# Patient Record
Sex: Male | Born: 1975 | Race: White | Hispanic: No | Marital: Single | Smoking: Current every day smoker
Health system: Southern US, Community
[De-identification: ages and names within clinical notes are randomized; demographics above are authoritative.]

## PROBLEM LIST (undated history)

## (undated) DIAGNOSIS — M25371 Other instability, right ankle: Secondary | ICD-10-CM

## (undated) DIAGNOSIS — D62 Acute posthemorrhagic anemia: Secondary | ICD-10-CM

## (undated) DIAGNOSIS — S82141A Displaced bicondylar fracture of right tibia, initial encounter for closed fracture: Secondary | ICD-10-CM

## (undated) DIAGNOSIS — F17200 Nicotine dependence, unspecified, uncomplicated: Secondary | ICD-10-CM

## (undated) DIAGNOSIS — Z8489 Family history of other specified conditions: Secondary | ICD-10-CM

## (undated) DIAGNOSIS — R112 Nausea with vomiting, unspecified: Secondary | ICD-10-CM

## (undated) DIAGNOSIS — Z9889 Other specified postprocedural states: Secondary | ICD-10-CM

## (undated) DIAGNOSIS — S82401A Unspecified fracture of shaft of right fibula, initial encounter for closed fracture: Secondary | ICD-10-CM

## (undated) HISTORY — PX: FRACTURE SURGERY: SHX138

---

## 1998-07-17 ENCOUNTER — Emergency Department (HOSPITAL_COMMUNITY): Admission: EM | Admit: 1998-07-17 | Discharge: 1998-07-17 | Payer: Self-pay | Admitting: Emergency Medicine

## 2001-08-26 ENCOUNTER — Encounter: Payer: Self-pay | Admitting: Physical Medicine and Rehabilitation

## 2001-08-26 ENCOUNTER — Ambulatory Visit (HOSPITAL_COMMUNITY)
Admission: RE | Admit: 2001-08-26 | Discharge: 2001-08-26 | Payer: Self-pay | Admitting: Physical Medicine and Rehabilitation

## 2001-09-08 ENCOUNTER — Encounter: Payer: Self-pay | Admitting: Orthopedic Surgery

## 2001-09-09 ENCOUNTER — Encounter: Payer: Self-pay | Admitting: Orthopedic Surgery

## 2001-09-09 ENCOUNTER — Observation Stay (HOSPITAL_COMMUNITY): Admission: AD | Admit: 2001-09-09 | Discharge: 2001-09-09 | Payer: Self-pay | Admitting: Orthopedic Surgery

## 2002-04-09 ENCOUNTER — Encounter: Admission: RE | Admit: 2002-04-09 | Discharge: 2002-04-09 | Payer: Self-pay | Admitting: Neurology

## 2002-04-09 ENCOUNTER — Encounter: Payer: Self-pay | Admitting: Neurology

## 2004-06-05 ENCOUNTER — Ambulatory Visit: Payer: Self-pay | Admitting: Internal Medicine

## 2006-03-25 ENCOUNTER — Emergency Department (HOSPITAL_COMMUNITY): Admission: EM | Admit: 2006-03-25 | Discharge: 2006-03-25 | Payer: Self-pay | Admitting: Emergency Medicine

## 2006-04-05 ENCOUNTER — Ambulatory Visit (HOSPITAL_BASED_OUTPATIENT_CLINIC_OR_DEPARTMENT_OTHER): Admission: RE | Admit: 2006-04-05 | Discharge: 2006-04-05 | Payer: Self-pay | Admitting: Orthopedic Surgery

## 2008-12-31 ENCOUNTER — Ambulatory Visit (HOSPITAL_BASED_OUTPATIENT_CLINIC_OR_DEPARTMENT_OTHER): Admission: RE | Admit: 2008-12-31 | Discharge: 2008-12-31 | Payer: Self-pay | Admitting: Orthopedic Surgery

## 2010-11-14 LAB — POCT HEMOGLOBIN-HEMACUE: Hemoglobin: 15.6 g/dL (ref 13.0–17.0)

## 2010-12-19 NOTE — Op Note (Signed)
NAME:  Troy Rios, Troy Rios NO.:  [id number]   MEDICAL RECORD NO.:  [id number]          PATIENT TYPE:  AMB   LOCATION:  DSC                          FACILITY:  [facility]   PHYSICIAN:  Harvie Junior, M.D.   DATE OF BIRTH:  November 26, 1975   DATE OF PROCEDURE:  12/31/2008  DATE OF DISCHARGE:                               OPERATIVE REPORT   PREOPERATIVE DIAGNOSIS:  Painful hardware, right forearm.   POSTOPERATIVE DIAGNOSIS:  Painful hardware, right forearm.   PROCEDURE:  Removal of painful hardware, right forearm.   SURGEON:  Harvie Junior, M.D.   ASSISTANT:  None.   ANESTHESIA:  General.   BRIEF HISTORY:  [name] is a 35 year old male with long history  of having had fracture in the right ulnar.  He began having pain over  the area of the hardware and after failure of conservative care, he was  also taken to the operating room for removal of painful hardware.   PROCEDURE IN DETAIL:  The patient was taken to the operating room.  After adequate anesthesia obtained with general anesthetic, the patient  was placed supine on the operating room table.  The right arm was  prepped and draped in usual sterile fashion.  Following this, the lower  arm was exsanguinated with blood pressure.  Tourniquet was inflated to  250 mmHg.  Following this, incision was made over his old incision in  subcutaneous tissue  down to the level of the hardware.  Hardware was  identified and all screws were removed as well as plates.  The wound was  curetted around the bone.  Once that was completed, the wound was  copiously and thoroughly irrigated, suctioned dry.  The wound was then  closed with a combination of 2-0 Vicryl and 3-0 Monocryl subcuticular.  Benzoin and Steri-Strips were applied.  A 10 mL of 0.25% Marcaine was  instilled into wound for postoperative anesthesia.  A sterile  compressive dressing was applied.  At this point, the patient was taken  to the recovery and was noted to  be in a satisfactory condition.  Estimated blood loss for this procedure was none.      Harvie Junior, M.D.  Electronically Signed     JLG/MEDQ  D:  12/31/2008  T:  12/31/2008  Job:  [id number]
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
## 2016-10-18 ENCOUNTER — Emergency Department (HOSPITAL_COMMUNITY): Payer: 59

## 2016-10-18 ENCOUNTER — Inpatient Hospital Stay (HOSPITAL_COMMUNITY)
Admission: EM | Admit: 2016-10-18 | Discharge: 2016-10-23 | DRG: 481 | Disposition: A | Discharge status: Home Health | Payer: 59 | Attending: Orthopedic Surgery | Admitting: Orthopedic Surgery

## 2016-10-18 ENCOUNTER — Encounter (HOSPITAL_COMMUNITY): Payer: Self-pay

## 2016-10-18 DIAGNOSIS — S8264XA Nondisplaced fracture of lateral malleolus of right fibula, initial encounter for closed fracture: Secondary | ICD-10-CM | POA: Diagnosis present

## 2016-10-18 DIAGNOSIS — Z881 Allergy status to other antibiotic agents status: Secondary | ICD-10-CM | POA: Diagnosis not present

## 2016-10-18 DIAGNOSIS — M25371 Other instability, right ankle: Secondary | ICD-10-CM | POA: Diagnosis present

## 2016-10-18 DIAGNOSIS — S42001A Fracture of unspecified part of right clavicle, initial encounter for closed fracture: Secondary | ICD-10-CM

## 2016-10-18 DIAGNOSIS — S82141A Displaced bicondylar fracture of right tibia, initial encounter for closed fracture: Secondary | ICD-10-CM | POA: Diagnosis present

## 2016-10-18 DIAGNOSIS — D62 Acute posthemorrhagic anemia: Secondary | ICD-10-CM | POA: Diagnosis not present

## 2016-10-18 DIAGNOSIS — S9304XA Dislocation of right ankle joint, initial encounter: Secondary | ICD-10-CM | POA: Diagnosis present

## 2016-10-18 DIAGNOSIS — M7042 Prepatellar bursitis, left knee: Secondary | ICD-10-CM | POA: Diagnosis present

## 2016-10-18 DIAGNOSIS — S82401A Unspecified fracture of shaft of right fibula, initial encounter for closed fracture: Secondary | ICD-10-CM | POA: Diagnosis present

## 2016-10-18 DIAGNOSIS — S7290XA Unspecified fracture of unspecified femur, initial encounter for closed fracture: Secondary | ICD-10-CM | POA: Diagnosis present

## 2016-10-18 DIAGNOSIS — Q899 Congenital malformation, unspecified: Secondary | ICD-10-CM

## 2016-10-18 DIAGNOSIS — M79604 Pain in right leg: Secondary | ICD-10-CM | POA: Diagnosis present

## 2016-10-18 DIAGNOSIS — S72402A Unspecified fracture of lower end of left femur, initial encounter for closed fracture: Secondary | ICD-10-CM

## 2016-10-18 DIAGNOSIS — S72351A Displaced comminuted fracture of shaft of right femur, initial encounter for closed fracture: Secondary | ICD-10-CM | POA: Diagnosis present

## 2016-10-18 DIAGNOSIS — Z419 Encounter for procedure for purposes other than remedying health state, unspecified: Secondary | ICD-10-CM

## 2016-10-18 DIAGNOSIS — Z880 Allergy status to penicillin: Secondary | ICD-10-CM

## 2016-10-18 DIAGNOSIS — F17200 Nicotine dependence, unspecified, uncomplicated: Secondary | ICD-10-CM | POA: Diagnosis present

## 2016-10-18 HISTORY — DX: Displaced bicondylar fracture of right tibia, initial encounter for closed fracture: S82.141A

## 2016-10-18 HISTORY — DX: Other instability, right ankle: M25.371

## 2016-10-18 HISTORY — DX: Unspecified fracture of shaft of right fibula, initial encounter for closed fracture: S82.401A

## 2016-10-18 HISTORY — DX: Acute posthemorrhagic anemia: D62

## 2016-10-18 HISTORY — DX: Nicotine dependence, unspecified, uncomplicated: F17.200

## 2016-10-18 HISTORY — DX: Nausea with vomiting, unspecified: R11.2

## 2016-10-18 HISTORY — DX: Family history of other specified conditions: Z84.89

## 2016-10-18 HISTORY — DX: Other specified postprocedural states: Z98.890

## 2016-10-18 LAB — COMPREHENSIVE METABOLIC PANEL
ALBUMIN: 4.2 g/dL (ref 3.5–5.0)
ALK PHOS: 69 U/L (ref 38–126)
ALT: 26 U/L (ref 17–63)
AST: 28 U/L (ref 15–41)
Anion gap: 12 (ref 5–15)
BILIRUBIN TOTAL: 0.7 mg/dL (ref 0.3–1.2)
BUN: 11 mg/dL (ref 6–20)
CALCIUM: 9.2 mg/dL (ref 8.9–10.3)
CO2: 24 mmol/L (ref 22–32)
Chloride: 103 mmol/L (ref 101–111)
Creatinine, Ser: 0.92 mg/dL (ref 0.61–1.24)
GFR calc Af Amer: >60 mL/min (ref >60)
GFR calc non Af Amer: >60 mL/min (ref >60)
Glucose, Bld: 126 mg/dL — ABNORMAL HIGH (ref 65–99)
POTASSIUM: 3.4 mmol/L — AB (ref 3.5–5.1)
SODIUM: 139 mmol/L (ref 135–145)
TOTAL PROTEIN: 7.1 g/dL (ref 6.5–8.1)

## 2016-10-18 LAB — CBC
HEMATOCRIT: 40.4 % (ref 39.0–52.0)
Hemoglobin: 13.9 g/dL (ref 13.0–17.0)
MCH: 32.6 pg (ref 26.0–34.0)
MCHC: 34.4 g/dL (ref 30.0–36.0)
MCV: 94.8 fL (ref 78.0–100.0)
Platelets: 233 10*3/uL (ref 150–400)
RBC: 4.26 MIL/uL (ref 4.22–5.81)
RDW: 12.4 % (ref 11.5–15.5)
WBC: 7.4 10*3/uL (ref 4.0–10.5)

## 2016-10-18 LAB — SAMPLE TO BLOOD BANK

## 2016-10-18 LAB — ETHANOL: Alcohol, Ethyl (B): <5 mg/dL (ref <5)

## 2016-10-18 LAB — PROTIME-INR
INR: 0.95
Prothrombin Time: 12.7 seconds (ref 11.4–15.2)

## 2016-10-18 MED ORDER — FENTANYL CITRATE (PF) 100 MCG/2ML IJ SOLN
INTRAMUSCULAR | Status: AC
  Filled 2016-10-18: qty 2

## 2016-10-18 MED ORDER — HYDROMORPHONE HCL 1 MG/ML IJ SOLN
INTRAMUSCULAR | Status: AC | PRN
  Administered 2016-10-18 (×2): 1 mg via INTRAVENOUS

## 2016-10-18 MED ORDER — HYDROMORPHONE HCL 1 MG/ML IJ SOLN
INTRAMUSCULAR | Status: AC
  Filled 2016-10-18: qty 1

## 2016-10-18 MED ORDER — FENTANYL CITRATE (PF) 100 MCG/2ML IJ SOLN
INTRAMUSCULAR | Status: AC | PRN
  Administered 2016-10-18: 100 ug via INTRAVENOUS

## 2016-10-18 MED ORDER — PHENYLEPHRINE HCL 10 MG/ML IJ SOLN: 0.0000 ug/min | Freq: Once | INTRAVENOUS | Status: DC
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
## 2016-10-18 NOTE — ED Notes (Signed)
Pt to ct with this rn and ortho
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
## 2016-10-18 NOTE — ED Notes (Signed)
Family at beside. Family given emotional support. 

## 2016-10-18 NOTE — ED Notes (Signed)
Ortho at bedside applying traction
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
## 2016-10-18 NOTE — ED Notes (Addendum)
pts left knee is increasingly swollen, edp notified and x ray ordered, ice applied to left knee

## 2016-10-18 NOTE — ED Notes (Signed)
Awaiting for ortho, patient and family updated

## 2016-10-18 NOTE — ED Notes (Signed)
Returned from ct, traction still in place, cms intact, pulses strong bilaterally

## 2016-10-19 ENCOUNTER — Encounter (HOSPITAL_COMMUNITY): Payer: Self-pay | Admitting: Certified Registered Nurse Anesthetist

## 2016-10-19 ENCOUNTER — Inpatient Hospital Stay (HOSPITAL_COMMUNITY): Payer: 59

## 2016-10-19 ENCOUNTER — Inpatient Hospital Stay (HOSPITAL_COMMUNITY): Payer: 59 | Admitting: Certified Registered Nurse Anesthetist

## 2016-10-19 ENCOUNTER — Encounter (HOSPITAL_COMMUNITY)
Admission: EM | Disposition: A | Discharge status: Home Health | Payer: Self-pay | Source: Home / Self Care | Attending: Orthopedic Surgery

## 2016-10-19 ENCOUNTER — Encounter (HOSPITAL_COMMUNITY): Payer: Self-pay

## 2016-10-19 HISTORY — PX: FEMUR IM NAIL: SHX1597

## 2016-10-19 LAB — COMPREHENSIVE METABOLIC PANEL
ALT: 25 U/L (ref 17–63)
AST: 41 U/L (ref 15–41)
Albumin: 3.9 g/dL (ref 3.5–5.0)
Alkaline Phosphatase: 66 U/L (ref 38–126)
Anion gap: 12 (ref 5–15)
BILIRUBIN TOTAL: 1.1 mg/dL (ref 0.3–1.2)
BUN: 12 mg/dL (ref 6–20)
CALCIUM: 8.8 mg/dL — AB (ref 8.9–10.3)
CO2: 24 mmol/L (ref 22–32)
Chloride: 101 mmol/L (ref 101–111)
Creatinine, Ser: 0.7 mg/dL (ref 0.61–1.24)
GFR calc Af Amer: >60 mL/min (ref >60)
Glucose, Bld: 154 mg/dL — ABNORMAL HIGH (ref 65–99)
POTASSIUM: 3.6 mmol/L (ref 3.5–5.1)
Sodium: 137 mmol/L (ref 135–145)
TOTAL PROTEIN: 6.6 g/dL (ref 6.5–8.1)

## 2016-10-19 LAB — LACTIC ACID, PLASMA: LACTIC ACID, VENOUS: 1.4 mmol/L (ref 0.5–1.9)

## 2016-10-19 LAB — URINALYSIS, ROUTINE W REFLEX MICROSCOPIC
BILIRUBIN URINE: NEGATIVE
GLUCOSE, UA: NEGATIVE mg/dL
HGB URINE DIPSTICK: NEGATIVE
Ketones, ur: NEGATIVE mg/dL
LEUKOCYTES UA: NEGATIVE
NITRITE: NEGATIVE
Protein, ur: 30 mg/dL — AB
SPECIFIC GRAVITY, URINE: 1.026 (ref 1.005–1.030)
pH: 5 (ref 5.0–8.0)

## 2016-10-19 LAB — CBC
HEMATOCRIT: 35.8 % — AB (ref 39.0–52.0)
Hemoglobin: 12 g/dL — ABNORMAL LOW (ref 13.0–17.0)
MCH: 31.8 pg (ref 26.0–34.0)
MCHC: 33.5 g/dL (ref 30.0–36.0)
MCV: 95 fL (ref 78.0–100.0)
PLATELETS: 215 10*3/uL (ref 150–400)
RBC: 3.77 MIL/uL — ABNORMAL LOW (ref 4.22–5.81)
RDW: 12.4 % (ref 11.5–15.5)
WBC: 8.5 10*3/uL (ref 4.0–10.5)

## 2016-10-19 LAB — HIV ANTIBODY (ROUTINE TESTING W REFLEX): HIV Screen 4th Generation wRfx: NONREACTIVE

## 2016-10-19 LAB — CDS SEROLOGY

## 2016-10-19 LAB — SURGICAL PCR SCREEN
MRSA, PCR: NEGATIVE
Staphylococcus aureus: POSITIVE — AB

## 2016-10-19 SURGERY — INSERTION, INTRAMEDULLARY ROD, FEMUR, RETROGRADE
Anesthesia: General | Laterality: Right

## 2016-10-19 SURGERY — INSERTION, INTRAMEDULLARY ROD, FEMUR
Anesthesia: General | Laterality: Right

## 2016-10-19 MED ORDER — LABETALOL HCL 5 MG/ML IV SOLN
INTRAVENOUS | Status: DC | PRN
  Administered 2016-10-19: 10 mg via INTRAVENOUS

## 2016-10-19 MED ORDER — CEFAZOLIN SODIUM-DEXTROSE 2-4 GM/100ML-% IV SOLN
INTRAVENOUS | Status: AC
  Filled 2016-10-19: qty 100

## 2016-10-19 MED ORDER — MIDAZOLAM HCL 2 MG/2ML IJ SOLN
INTRAMUSCULAR | Status: AC
  Filled 2016-10-19: qty 2

## 2016-10-19 MED ORDER — METHOCARBAMOL 1000 MG/10ML IJ SOLN
1000.0000 mg | Freq: Four times a day (QID) | INTRAVENOUS | Status: DC
  Administered 2016-10-19: 1000 mg via INTRAVENOUS
  Filled 2016-10-19 (×10): qty 10

## 2016-10-19 MED ORDER — METOCLOPRAMIDE HCL 5 MG PO TABS: 5.0000 mg | ORAL_TABLET | Freq: Three times a day (TID) | ORAL | Status: DC | PRN

## 2016-10-19 MED ORDER — LIDOCAINE HCL (CARDIAC) 20 MG/ML IV SOLN
INTRAVENOUS | Status: DC | PRN
  Administered 2016-10-19: 100 mg via INTRAVENOUS

## 2016-10-19 MED ORDER — FENTANYL CITRATE (PF) 100 MCG/2ML IJ SOLN
INTRAMUSCULAR | Status: DC | PRN
  Administered 2016-10-19 (×2): 50 ug via INTRAVENOUS
  Administered 2016-10-19: 100 ug via INTRAVENOUS
  Administered 2016-10-19 (×4): 50 ug via INTRAVENOUS

## 2016-10-19 MED ORDER — PANTOPRAZOLE SODIUM 40 MG PO TBEC
40.0000 mg | DELAYED_RELEASE_TABLET | Freq: Every day | ORAL | Status: DC
  Administered 2016-10-20 – 2016-10-23 (×4): 40 mg via ORAL
  Filled 2016-10-19 (×4): qty 1

## 2016-10-19 MED ORDER — DOCUSATE SODIUM 100 MG PO CAPS
100.0000 mg | ORAL_CAPSULE | Freq: Two times a day (BID) | ORAL | Status: DC
  Administered 2016-10-19 – 2016-10-22 (×6): 100 mg via ORAL
  Filled 2016-10-19 (×6): qty 1

## 2016-10-19 MED ORDER — DIPHENHYDRAMINE HCL 12.5 MG/5ML PO ELIX
12.5000 mg | ORAL_SOLUTION | Freq: Four times a day (QID) | ORAL | Status: DC | PRN
  Administered 2016-10-21: 12.5 mg via ORAL
  Filled 2016-10-19: qty 10

## 2016-10-19 MED ORDER — ONDANSETRON HCL 4 MG/2ML IJ SOLN: 4.0000 mg | Freq: Four times a day (QID) | INTRAMUSCULAR | Status: DC | PRN

## 2016-10-19 MED ORDER — ACETAMINOPHEN 650 MG RE SUPP: 650.0000 mg | Freq: Four times a day (QID) | RECTAL | Status: DC | PRN

## 2016-10-19 MED ORDER — MIDAZOLAM HCL 5 MG/5ML IJ SOLN
INTRAMUSCULAR | Status: DC | PRN
  Administered 2016-10-19: 2 mg via INTRAVENOUS

## 2016-10-19 MED ORDER — KETOROLAC TROMETHAMINE 30 MG/ML IJ SOLN
30.0000 mg | Freq: Once | INTRAMUSCULAR | Status: AC
  Administered 2016-10-19: 30 mg via INTRAVENOUS

## 2016-10-19 MED ORDER — SODIUM CHLORIDE 0.9% FLUSH: 9.0000 mL | INTRAVENOUS | Status: DC | PRN

## 2016-10-19 MED ORDER — DIPHENHYDRAMINE HCL 50 MG/ML IJ SOLN: 12.5000 mg | Freq: Four times a day (QID) | INTRAMUSCULAR | Status: DC | PRN

## 2016-10-19 MED ORDER — PROPOFOL 10 MG/ML IV BOLUS
INTRAVENOUS | Status: AC
  Filled 2016-10-19: qty 20

## 2016-10-19 MED ORDER — ACETAMINOPHEN 325 MG PO TABS: 650.0000 mg | ORAL_TABLET | Freq: Four times a day (QID) | ORAL | Status: DC | PRN

## 2016-10-19 MED ORDER — SUGAMMADEX SODIUM 200 MG/2ML IV SOLN
INTRAVENOUS | Status: DC | PRN
  Administered 2016-10-19: 200 mg via INTRAVENOUS

## 2016-10-19 MED ORDER — METHOCARBAMOL 500 MG PO TABS
1000.0000 mg | ORAL_TABLET | Freq: Four times a day (QID) | ORAL | Status: DC
  Administered 2016-10-20 – 2016-10-23 (×12): 1000 mg via ORAL
  Filled 2016-10-19 (×13): qty 2

## 2016-10-19 MED ORDER — DIPHENHYDRAMINE HCL 50 MG/ML IJ SOLN
12.5000 mg | Freq: Three times a day (TID) | INTRAMUSCULAR | Status: DC | PRN
  Administered 2016-10-19: 12.5 mg via INTRAVENOUS
  Filled 2016-10-19: qty 1

## 2016-10-19 MED ORDER — HYDROMORPHONE HCL 1 MG/ML IJ SOLN
0.2500 mg | INTRAMUSCULAR | Status: DC | PRN
  Administered 2016-10-19: 0.5 mg via INTRAVENOUS

## 2016-10-19 MED ORDER — SUCCINYLCHOLINE CHLORIDE 200 MG/10ML IV SOSY
PREFILLED_SYRINGE | INTRAVENOUS | Status: DC | PRN
  Administered 2016-10-19: 120 mg via INTRAVENOUS

## 2016-10-19 MED ORDER — ROCURONIUM BROMIDE 100 MG/10ML IV SOLN
INTRAVENOUS | Status: DC | PRN
  Administered 2016-10-19: 50 mg via INTRAVENOUS
  Administered 2016-10-19: 30 mg via INTRAVENOUS
  Administered 2016-10-19: 20 mg via INTRAVENOUS

## 2016-10-19 MED ORDER — FENTANYL CITRATE (PF) 100 MCG/2ML IJ SOLN
INTRAMUSCULAR | Status: AC
  Filled 2016-10-19: qty 2

## 2016-10-19 MED ORDER — CEFAZOLIN SODIUM-DEXTROSE 2-3 GM-% IV SOLR
INTRAVENOUS | Status: DC | PRN
  Administered 2016-10-19: 2 g via INTRAVENOUS

## 2016-10-19 MED ORDER — LABETALOL HCL 5 MG/ML IV SOLN
INTRAVENOUS | Status: AC
  Filled 2016-10-19: qty 4

## 2016-10-19 MED ORDER — HYDROMORPHONE HCL 2 MG/ML IJ SOLN
1.0000 mg | INTRAMUSCULAR | Status: DC | PRN
  Administered 2016-10-19 (×3): 1 mg via INTRAVENOUS
  Filled 2016-10-19 (×3): qty 1

## 2016-10-19 MED ORDER — MEPERIDINE HCL 25 MG/ML IJ SOLN: 6.2500 mg | INTRAMUSCULAR | Status: DC | PRN

## 2016-10-19 MED ORDER — ONDANSETRON HCL 4 MG/2ML IJ SOLN
INTRAMUSCULAR | Status: DC | PRN
  Administered 2016-10-19: 4 mg via INTRAVENOUS

## 2016-10-19 MED ORDER — LACTATED RINGERS IV SOLN
INTRAVENOUS | Status: DC
  Administered 2016-10-19 (×3): via INTRAVENOUS

## 2016-10-19 MED ORDER — ONDANSETRON HCL 4 MG PO TABS: 4.0000 mg | ORAL_TABLET | Freq: Four times a day (QID) | ORAL | Status: DC | PRN

## 2016-10-19 MED ORDER — CLINDAMYCIN PHOSPHATE 600 MG/50ML IV SOLN
600.0000 mg | Freq: Four times a day (QID) | INTRAVENOUS | Status: AC
  Administered 2016-10-19 – 2016-10-20 (×3): 600 mg via INTRAVENOUS
  Filled 2016-10-19 (×3): qty 50

## 2016-10-19 MED ORDER — HYDROMORPHONE HCL 1 MG/ML IJ SOLN
INTRAMUSCULAR | Status: AC
  Administered 2016-10-19: 0.5 mg via INTRAVENOUS
  Filled 2016-10-19: qty 0.5

## 2016-10-19 MED ORDER — FENTANYL CITRATE (PF) 100 MCG/2ML IJ SOLN
INTRAMUSCULAR | Status: AC
  Filled 2016-10-19: qty 4

## 2016-10-19 MED ORDER — IPRATROPIUM BROMIDE 0.02 % IN SOLN
0.5000 mg | Freq: Once | RESPIRATORY_TRACT | Status: AC
  Administered 2016-10-19: 0.5 mg via RESPIRATORY_TRACT
  Filled 2016-10-19: qty 2.5

## 2016-10-19 MED ORDER — PROMETHAZINE HCL 25 MG/ML IJ SOLN: 6.2500 mg | INTRAMUSCULAR | Status: DC | PRN

## 2016-10-19 MED ORDER — NALOXONE HCL 0.4 MG/ML IJ SOLN: 0.4000 mg | INTRAMUSCULAR | Status: DC | PRN

## 2016-10-19 MED ORDER — KETOROLAC TROMETHAMINE 30 MG/ML IJ SOLN
INTRAMUSCULAR | Status: AC
  Administered 2016-10-19: 30 mg via INTRAVENOUS
  Filled 2016-10-19: qty 1

## 2016-10-19 MED ORDER — DEXAMETHASONE SODIUM PHOSPHATE 10 MG/ML IJ SOLN
INTRAMUSCULAR | Status: DC | PRN
  Administered 2016-10-19: 10 mg via INTRAVENOUS

## 2016-10-19 MED ORDER — ENOXAPARIN SODIUM 40 MG/0.4ML ~~LOC~~ SOLN: 40.0000 mg | Freq: Every day | SUBCUTANEOUS | Status: DC

## 2016-10-19 MED ORDER — MORPHINE SULFATE (PF) 2 MG/ML IV SOLN
1.0000 mg | INTRAVENOUS | Status: DC | PRN
  Administered 2016-10-19 (×4): 4 mg via INTRAVENOUS
  Filled 2016-10-19 (×4): qty 2

## 2016-10-19 MED ORDER — MAGNESIUM CITRATE PO SOLN
1.0000 | Freq: Once | ORAL | Status: AC | PRN
  Administered 2016-10-21: 1 via ORAL
  Filled 2016-10-19: qty 296

## 2016-10-19 MED ORDER — POLYETHYLENE GLYCOL 3350 17 G PO PACK
17.0000 g | PACK | Freq: Every day | ORAL | Status: DC
  Administered 2016-10-20 – 2016-10-21 (×2): 17 g via ORAL
  Filled 2016-10-19 (×4): qty 1

## 2016-10-19 MED ORDER — ONDANSETRON HCL 4 MG PO TABS
4.0000 mg | ORAL_TABLET | Freq: Four times a day (QID) | ORAL | Status: DC | PRN
Start: 2016-10-19 — End: 2016-10-19

## 2016-10-19 MED ORDER — MUPIROCIN 2 % EX OINT
1.0000 "application " | TOPICAL_OINTMENT | Freq: Two times a day (BID) | CUTANEOUS | Status: DC
  Administered 2016-10-19 – 2016-10-23 (×8): 1 via NASAL
  Filled 2016-10-19: qty 22

## 2016-10-19 MED ORDER — CHLORHEXIDINE GLUCONATE CLOTH 2 % EX PADS
6.0000 | MEDICATED_PAD | Freq: Every day | CUTANEOUS | Status: DC
  Administered 2016-10-19: 6 via TOPICAL

## 2016-10-19 MED ORDER — POTASSIUM CHLORIDE IN NACL 20-0.9 MEQ/L-% IV SOLN
INTRAVENOUS | Status: DC
  Administered 2016-10-19 – 2016-10-20 (×2): via INTRAVENOUS
  Filled 2016-10-19 (×7): qty 1000

## 2016-10-19 MED ORDER — PROPOFOL 10 MG/ML IV BOLUS
INTRAVENOUS | Status: DC | PRN
  Administered 2016-10-19: 200 mg via INTRAVENOUS

## 2016-10-19 MED ORDER — PROMETHAZINE HCL 25 MG/ML IJ SOLN: 12.5000 mg | Freq: Four times a day (QID) | INTRAMUSCULAR | Status: DC | PRN

## 2016-10-19 MED ORDER — ENOXAPARIN SODIUM 40 MG/0.4ML ~~LOC~~ SOLN
40.0000 mg | SUBCUTANEOUS | Status: DC
  Administered 2016-10-20 – 2016-10-23 (×4): 40 mg via SUBCUTANEOUS
  Filled 2016-10-19 (×4): qty 0.4

## 2016-10-19 MED ORDER — METOCLOPRAMIDE HCL 5 MG/ML IJ SOLN
5.0000 mg | Freq: Three times a day (TID) | INTRAMUSCULAR | Status: DC | PRN
Start: 2016-10-19 — End: 2016-10-23

## 2016-10-19 MED ORDER — PANTOPRAZOLE SODIUM 40 MG IV SOLR
40.0000 mg | Freq: Every day | INTRAVENOUS | Status: DC
  Administered 2016-10-19: 40 mg via INTRAVENOUS
  Filled 2016-10-19: qty 40

## 2016-10-19 MED ORDER — NICOTINE 14 MG/24HR TD PT24
14.0000 mg | MEDICATED_PATCH | Freq: Every day | TRANSDERMAL | Status: DC
  Administered 2016-10-19: 14 mg via TRANSDERMAL
  Filled 2016-10-19: qty 1

## 2016-10-19 MED ORDER — OXYCODONE HCL 5 MG PO TABS
5.0000 mg | ORAL_TABLET | ORAL | Status: DC | PRN
  Administered 2016-10-19: 10 mg via ORAL
  Administered 2016-10-22: 5 mg via ORAL
  Administered 2016-10-23: 10 mg via ORAL
  Filled 2016-10-19 (×2): qty 2
  Filled 2016-10-19: qty 1

## 2016-10-19 MED ORDER — ALBUTEROL SULFATE HFA 108 (90 BASE) MCG/ACT IN AERS
INHALATION_SPRAY | RESPIRATORY_TRACT | Status: AC
  Filled 2016-10-19: qty 6.7

## 2016-10-19 MED ORDER — HYDROMORPHONE 1 MG/ML IV SOLN
INTRAVENOUS | Status: DC
  Administered 2016-10-20 (×2): 2.4 mg via INTRAVENOUS
  Administered 2016-10-20: 0.5 mg via INTRAVENOUS
  Administered 2016-10-20: 4.2 mg via INTRAVENOUS
  Administered 2016-10-20: 2.7 mg via INTRAVENOUS
  Administered 2016-10-21: 3.6 mg via INTRAVENOUS
  Administered 2016-10-21: 2.4 mg via INTRAVENOUS
  Administered 2016-10-21: 2.1 mg via INTRAVENOUS
  Administered 2016-10-21: 3 mg via INTRAVENOUS
  Administered 2016-10-21: 1.2 mg via INTRAVENOUS
  Administered 2016-10-21: 13:00:00 via INTRAVENOUS
  Administered 2016-10-21: 3.3 mg via INTRAVENOUS
  Administered 2016-10-22: 3 mg via INTRAVENOUS
  Filled 2016-10-19 (×2): qty 25

## 2016-10-19 MED ORDER — KCL IN DEXTROSE-NACL 20-5-0.45 MEQ/L-%-% IV SOLN
INTRAVENOUS | Status: DC
  Administered 2016-10-19 (×2): via INTRAVENOUS
  Filled 2016-10-19 (×2): qty 1000

## 2016-10-19 MED ORDER — 0.9 % SODIUM CHLORIDE (POUR BTL) OPTIME
TOPICAL | Status: DC | PRN
  Administered 2016-10-19: 1000 mL

## 2016-10-19 SURGICAL SUPPLY — 71 items
BANDAGE ACE 4X5 VEL STRL LF (GAUZE/BANDAGES/DRESSINGS) ×1 IMPLANT
BANDAGE ELASTIC 6 VELCRO ST LF (GAUZE/BANDAGES/DRESSINGS) ×1 IMPLANT
BIT DRILL LONG 4.0MM (BIT) IMPLANT
BIT DRILL SHORT 4.0 (BIT) IMPLANT
BNDG COHESIVE 6X5 TAN STRL LF (GAUZE/BANDAGES/DRESSINGS) ×4 IMPLANT
BNDG GAUZE ELAST 4 BULKY (GAUZE/BANDAGES/DRESSINGS) ×1 IMPLANT
BRUSH SCRUB DISP (MISCELLANEOUS) ×5 IMPLANT
COVER PERINEAL POST (MISCELLANEOUS) ×1 IMPLANT
COVER SURGICAL LIGHT HANDLE (MISCELLANEOUS) ×3 IMPLANT
DRAPE C-ARMOR (DRAPES) ×2 IMPLANT
DRAPE ORTHO SPLIT 77X108 STRL (DRAPES) ×4
DRAPE PROXIMA HALF (DRAPES) IMPLANT
DRAPE SURG ORHT 6 SPLT 77X108 (DRAPES) ×2 IMPLANT
DRAPE U-SHAPE 47X51 STRL (DRAPES) ×2 IMPLANT
DRILL BIT LONG 4.0MM (BIT) ×4
DRILL BIT SHORT 4.0 (BIT) ×4
DRILL STEP  6.4 (BIT) ×1 IMPLANT
DRSG MEPILEX BORDER 4X4 (GAUZE/BANDAGES/DRESSINGS) ×2 IMPLANT
DRSG MEPILEX BORDER 4X8 (GAUZE/BANDAGES/DRESSINGS) ×2 IMPLANT
DRSG MEPITEL 3X4 ME34 (GAUZE/BANDAGES/DRESSINGS) ×1 IMPLANT
ELECT REM PT RETURN 9FT ADLT (ELECTROSURGICAL) ×2
ELECTRODE REM PT RTRN 9FT ADLT (ELECTROSURGICAL) ×1 IMPLANT
GLOVE BIO SURGEON STRL SZ7 (GLOVE) ×1 IMPLANT
GLOVE BIO SURGEON STRL SZ7.5 (GLOVE) ×2 IMPLANT
GLOVE BIO SURGEON STRL SZ8 (GLOVE) ×2 IMPLANT
GLOVE BIOGEL PI IND STRL 6.5 (GLOVE) IMPLANT
GLOVE BIOGEL PI IND STRL 7.5 (GLOVE) ×1 IMPLANT
GLOVE BIOGEL PI IND STRL 8 (GLOVE) ×1 IMPLANT
GLOVE BIOGEL PI INDICATOR 6.5 (GLOVE) ×1
GLOVE BIOGEL PI INDICATOR 7.5 (GLOVE) ×1
GLOVE BIOGEL PI INDICATOR 8 (GLOVE) ×1
GOWN STRL REUS W/ TWL LRG LVL3 (GOWN DISPOSABLE) ×2 IMPLANT
GOWN STRL REUS W/ TWL XL LVL3 (GOWN DISPOSABLE) ×1 IMPLANT
GOWN STRL REUS W/TWL LRG LVL3 (GOWN DISPOSABLE) ×6
GOWN STRL REUS W/TWL XL LVL3 (GOWN DISPOSABLE) ×2
GUIDE PIN 3.2X343 (PIN) ×1
GUIDE PIN 3.2X343MM (PIN) ×2
GUIDE ROD 3.0 (MISCELLANEOUS) ×2
KIT BASIN OR (CUSTOM PROCEDURE TRAY) ×2 IMPLANT
KIT ROOM TURNOVER OR (KITS) ×2 IMPLANT
MANIFOLD NEPTUNE II (INSTRUMENTS) ×2 IMPLANT
NAIL FEMORAL 10X42MM (Nail) ×1 IMPLANT
NS IRRIG 1000ML POUR BTL (IV SOLUTION) ×2 IMPLANT
PACK GENERAL/GYN (CUSTOM PROCEDURE TRAY) ×2 IMPLANT
PAD ARMBOARD 7.5X6 YLW CONV (MISCELLANEOUS) ×4 IMPLANT
PADDING CAST ABS 4INX4YD NS (CAST SUPPLIES) ×1
PADDING CAST ABS 6INX4YD NS (CAST SUPPLIES) ×1
PADDING CAST ABS COTTON 4X4 ST (CAST SUPPLIES) IMPLANT
PADDING CAST ABS COTTON 6X4 NS (CAST SUPPLIES) IMPLANT
PIN GUIDE 3.2X343MM (PIN) IMPLANT
ROD GUIDE 3.0 (MISCELLANEOUS) IMPLANT
SCREW 6.4X85 (Screw) ×1 IMPLANT
SCREW 6.4X90 (Screw) ×1 IMPLANT
SCREW TRIGEN LOW PROF 5.0X35 (Screw) ×1 IMPLANT
SCREW TRIGEN LOW PROF 5.0X42.5 (Screw) ×1 IMPLANT
SCRUB BETADINE 4OZ XXX (MISCELLANEOUS) ×1 IMPLANT
SLING S3 LATERAL DISP (MISCELLANEOUS) ×1 IMPLANT
SOLUTION BETADINE 4OZ (MISCELLANEOUS) ×1 IMPLANT
SPRAY BETADINE AEROSOL XXX (MISCELLANEOUS) ×1 IMPLANT
STAPLER VISISTAT 35W (STAPLE) ×3 IMPLANT
STOCKINETTE IMPERVIOUS LG (DRAPES) ×1 IMPLANT
SUT ETHILON 3 0 PS 1 (SUTURE) ×4 IMPLANT
SUT VIC AB 0 CT1 27 (SUTURE) ×2
SUT VIC AB 0 CT1 27XBRD ANBCTR (SUTURE) ×1 IMPLANT
SUT VIC AB 1 CT1 27 (SUTURE) ×2
SUT VIC AB 1 CT1 27XBRD ANBCTR (SUTURE) ×1 IMPLANT
SUT VIC AB 2-0 CT1 27 (SUTURE) ×4
SUT VIC AB 2-0 CT1 TAPERPNT 27 (SUTURE) ×1 IMPLANT
TOWEL OR 17X24 6PK STRL BLUE (TOWEL DISPOSABLE) ×2 IMPLANT
TOWEL OR 17X26 10 PK STRL BLUE (TOWEL DISPOSABLE) ×4 IMPLANT
WATER STERILE IRR 1000ML POUR (IV SOLUTION) ×2 IMPLANT

## 2016-10-19 NOTE — Progress Notes (Signed)
Patient ID: Troy RuddyDavid Gerome Rios, male   DOB: 1976/01/12, 41 y.o.   MRN: [id number]   LOS: 1 day   Subjective: No new c/o this morning. Pain medicine allowing him to get some rest. Swelling in left knee improved.   Objective: Vital signs in last 24 hours: Temp:  [98.2 F (36.8 C)] 98.2 F (36.8 C) (03/16 0133) Pulse Rate:  [75-110] 90 (03/16 0133) Resp:  [16-25] 18 (03/16 0133) BP: (114-161)/(72-119) 120/72 (03/16 0133) SpO2:  [84 %-100 %] 93 % (03/16 0133) Weight:  [99.8 kg (220 lb)] 99.8 kg (220 lb) (03/15 1916)    Laboratory  CBC  Recent Labs  10/18/16 1905 10/19/16 0429  WBC 7.4 8.5  HGB 13.9 12.0*  HCT 40.4 35.8*  PLT 233 215   BMET  Recent Labs  10/18/16 1905 10/19/16 0429  NA 139 137  K 3.4* 3.6  CL 103 101  CO2 24 24  GLUCOSE 126* 154*  BUN 11 12  CREATININE 0.92 0.70  CALCIUM 9.2 8.8*    Physical Exam General appearance: alert and no distress Extremities:  BUE: No TTP, no edema, pulses intact LLE: Pulses, sensation intact, ice pack on knee, did not examine (will plan to do so in OR) RLE: 2+ DP, sensation intact to foot, externally rotated lower leg, calf soft and NT, thigh firm, swollen, no TTP to pelvis or hip   Assessment/Plan: [facility] Right femur fx -- Plan for IMN this afternoon Right tibia eminence fx -- Plan TBD Left prepatellar bursitis -- Continue ice, exam under anesthesia  Likely transfer attending after surgery.    Freeman CaldronMichael J. Manaia Samad, PA-C Orthopedic Surgery 10/19/2016

## 2016-10-19 NOTE — Transfer of Care (Signed)
Immediate Anesthesia Transfer of Care Note  Patient: Troy RuddyDavid Gerome Rios  Procedure(s) Performed: Procedure(s): INTRAMEDULLARY (IM) NAIL FEMORAL(ANTEGRADE).CASTING RIGHT ANKLE (Right)  Patient Location: PACU  Anesthesia Type:General  Level of Consciousness: awake, alert  and oriented  Airway & Oxygen Therapy: Patient Spontanous Breathing and Patient connected to nasal cannula oxygen  Post-op Assessment: Report given to RN, Post -op Vital signs reviewed and stable and Patient moving all extremities  Post vital signs: Reviewed and stable  Last Vitals:  Vitals:   10/19/16 1342 10/19/16 1855  BP: (!) 141/98 (!) 164/96  Pulse: (!) 113 (!) 151  Resp: 18 (!) 21  Temp: 37.2 C     Last Pain:  Vitals:   10/19/16 1856  TempSrc:   PainSc: 0-No pain      Patients Stated Pain Goal: 3 (10/19/16 0622)  Complications: No apparent anesthesia complications

## 2016-10-19 NOTE — Anesthesia Preprocedure Evaluation (Addendum)
Anesthesia Evaluation  Patient identified by MRN, date of birth, ID band Patient awake    Reviewed: Allergy & Precautions, H&P , NPO status , Patient's Chart, lab work & pertinent test results  Airway Mallampati: II  TM Distance: >3 FB Neck ROM: full    Dental  (+) Teeth Intact   Pulmonary Current Smoker,    Pulmonary exam normal        Cardiovascular negative cardio ROS Normal cardiovascular exam Rhythm:Regular Rate:Normal     Neuro/Psych negative neurological ROS  negative psych ROS   GI/Hepatic negative GI ROS, Neg liver ROS,   Endo/Other  negative endocrine ROS  Renal/GU negative Renal ROS  negative genitourinary   Musculoskeletal negative musculoskeletal ROS (+)   Abdominal Normal abdominal exam  (+)   Peds  Hematology negative hematology ROS (+)   Anesthesia Other Findings   Reproductive/Obstetrics negative OB ROS                            Anesthesia Physical Anesthesia Plan  ASA: II  Anesthesia Plan: General   Post-op Pain Management:    Induction: Intravenous  Airway Management Planned: LMA and Oral ETT  Additional Equipment:   Intra-op Plan:   Post-operative Plan: Extubation in OR  Informed Consent: I have reviewed the patients History and Physical, chart, labs and discussed the procedure including the risks, benefits and alternatives for the proposed anesthesia with the patient or authorized representative who has indicated his/her understanding and acceptance.   Dental Advisory Given  Plan Discussed with: CRNA and Surgeon  Anesthesia Plan Comments:         Anesthesia Quick Evaluation

## 2016-10-19 NOTE — Care Management Note (Signed)
Case Management Note  Patient Details  Name: Troy Rios MRN: [id number] Date of Birth: 05-Feb-1976  Subjective/Objective:  [facility] femur f[facility], and Rt tibial plateau fx.  PTA, pt independent, lives with spouse.                    Action/Plan: Ortho surgery today; will follow for discharge planning post op.  Will need PT/OT evaluations.  Pt states his mother and "neighbors" can assist at dc.    Expected Discharge Date:                  Expected Discharge Plan:  Home w Home Health Services  In-House Referral:     Discharge planning Services  CM Consult  Post Acute Care Choice:    Choice offered to:     DME Arranged:    DME Agency:     HH Arranged:    HH Agency:     Status of Service:  In process, will continue to follow  If discussed at Long Length of Stay Meetings, dates discussed:    Additional Comments:  Quintella BatonJulie W. Gorman Safi, RN, BSN  Trauma/Neuro ICU Case Manager [phone]
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
## 2016-10-19 NOTE — Anesthesia Postprocedure Evaluation (Signed)
Anesthesia Post Note  Patient: Troy Rios  Procedure(s) Performed: Procedure(s) (LRB): INTRAMEDULLARY (IM) NAIL FEMORAL(ANTEGRADE).CASTING RIGHT ANKLE (Right)  Patient location during evaluation: PACU Anesthesia Type: General Level of consciousness: awake and alert Pain management: pain level controlled Vital Signs Assessment: post-procedure vital signs reviewed and stable Respiratory status: spontaneous breathing, nonlabored ventilation, respiratory function stable and patient connected to nasal cannula oxygen Cardiovascular status: blood pressure returned to baseline and stable Postop Assessment: no signs of nausea or vomiting Anesthetic complications: no       Last Vitals:  Vitals:   10/19/16 1925 10/19/16 1940  BP: (!) 140/96 (!) 146/88  Pulse: 99   Resp: 17   Temp:      Last Pain:  Vitals:   10/19/16 1925  TempSrc:   PainSc: 0-No pain                 Khamia Stambaugh,W. EDMOND

## 2016-10-19 NOTE — Progress Notes (Signed)
Patient arrived to room 5N bed 8 from ED.  Placed patient in traction per MD order, assessment and admission database completed.  Will continue to monitor telemetry and pain.  Call bell within reach, patient educated on room orientation and how to use the call bell.

## 2016-10-19 NOTE — Anesthesia Procedure Notes (Signed)
Procedure Name: Intubation Date/Time: 10/19/2016 3:13 PM Performed by: Valda Favia Pre-anesthesia Checklist: Patient identified, Emergency Drugs available, Suction available, Patient being monitored and Timeout performed Patient Re-evaluated:Patient Re-evaluated prior to inductionOxygen Delivery Method: Circle system utilized Preoxygenation: Pre-oxygenation with 100% oxygen Intubation Type: IV induction Ventilation: Mask ventilation without difficulty Laryngoscope Size: Mac and 4 Grade View: Grade I Tube type: Oral Tube size: 7.5 mm Number of attempts: 1 Airway Equipment and Method: Stylet Placement Confirmation: ETT inserted through vocal cords under direct vision,  positive ETCO2 and breath sounds checked- equal and bilateral Secured at: 21 cm Tube secured with: Tape Dental Injury: Teeth and Oropharynx as per pre-operative assessment
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
## 2016-10-20 LAB — BASIC METABOLIC PANEL
ANION GAP: 8 (ref 5–15)
BUN: 10 mg/dL (ref 6–20)
CO2: 24 mmol/L (ref 22–32)
Calcium: 8 mg/dL — ABNORMAL LOW (ref 8.9–10.3)
Chloride: 101 mmol/L (ref 101–111)
Creatinine, Ser: 0.76 mg/dL (ref 0.61–1.24)
GFR calc Af Amer: >60 mL/min (ref >60)
GLUCOSE: 144 mg/dL — AB (ref 65–99)
POTASSIUM: 3.6 mmol/L (ref 3.5–5.1)
Sodium: 133 mmol/L — ABNORMAL LOW (ref 135–145)

## 2016-10-20 LAB — CBC
HEMATOCRIT: 25.7 % — AB (ref 39.0–52.0)
HEMOGLOBIN: 8.7 g/dL — AB (ref 13.0–17.0)
MCH: 32.4 pg (ref 26.0–34.0)
MCHC: 34.2 g/dL (ref 30.0–36.0)
MCV: 94.5 fL (ref 78.0–100.0)
Platelets: 159 10*3/uL (ref 150–400)
RBC: 2.72 MIL/uL — AB (ref 4.22–5.81)
RDW: 12.2 % (ref 11.5–15.5)
WBC: 8.9 10*3/uL (ref 4.0–10.5)
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
## 2016-10-20 NOTE — Progress Notes (Signed)
PT is recommending HHPT, 24 hr supervision, RW, and a 3-n-1-BSC.  Met with pt at bedside. He plans to return home with the support of his mother and neighbors. He has a RW. He declined the 3-n-1-BSC. He stated that his bedroom is too small and the [facility] won't fit. Discussed insurance coverage for HHPT. Pt is Medicaid potential. He stated that he has insurance. He doesn't remember the name of the insurance company. Asked pt if someone can bring the insurance information. He stated that he has to look in his files for the information. Discussed preference for a [facility] agency. He agreed to use [facility]. He stated that he will provide the insurance information to [facility] once he is D/C. Contacted Jamaine at [facility] for referral and informed him that the pt will provide the insurance information once he is D/C.

## 2016-10-20 NOTE — Addendum Note (Signed)
Addendum  created 10/20/16 0746 by Heather RobertsJames Rafal Archuleta, MD   Anesthesia Attestations filed

## 2016-10-20 NOTE — Progress Notes (Signed)
Orthopedic Tech Progress Note Patient Details:  Kathrin RuddyDavid Gerome Northington [birth year] [id number]  Patient ID: Kathrin Ruddyavid Gerome Mcgregory, male   DOB: [birth year], 41 y.o.   MRN: [id number]   Nikki DomCrawford, Kelsa Jaworowski 10/20/2016, 9:31 AM Called in advanced brace order; spoke with answering service
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
## 2016-10-21 ENCOUNTER — Encounter (HOSPITAL_COMMUNITY): Payer: Self-pay | Admitting: Orthopedic Surgery

## 2016-10-21 DIAGNOSIS — D62 Acute posthemorrhagic anemia: Secondary | ICD-10-CM

## 2016-10-21 HISTORY — DX: Acute posthemorrhagic anemia: D62

## 2016-10-21 LAB — CBC
HEMATOCRIT: 20.8 % — AB (ref 39.0–52.0)
HEMOGLOBIN: 7.1 g/dL — AB (ref 13.0–17.0)
MCH: 32.3 pg (ref 26.0–34.0)
MCHC: 34.1 g/dL (ref 30.0–36.0)
MCV: 94.5 fL (ref 78.0–100.0)
PLATELETS: 157 10*3/uL (ref 150–400)
RBC: 2.2 MIL/uL — AB (ref 4.22–5.81)
RDW: 12.2 % (ref 11.5–15.5)
WBC: 6.6 10*3/uL (ref 4.0–10.5)

## 2016-10-21 LAB — BASIC METABOLIC PANEL
ANION GAP: 6 (ref 5–15)
BUN: 6 mg/dL (ref 6–20)
CHLORIDE: 102 mmol/L (ref 101–111)
CO2: 26 mmol/L (ref 22–32)
CREATININE: 0.67 mg/dL (ref 0.61–1.24)
Calcium: 8 mg/dL — ABNORMAL LOW (ref 8.9–10.3)
GFR calc non Af Amer: >60 mL/min (ref >60)
Glucose, Bld: 108 mg/dL — ABNORMAL HIGH (ref 65–99)
POTASSIUM: 3.5 mmol/L (ref 3.5–5.1)
SODIUM: 134 mmol/L — AB (ref 135–145)

## 2016-10-21 NOTE — Progress Notes (Signed)
Patient ID: Troy Rios, male   DOB: 07/26/1976, 41 y.o.   MRN: [id number]     Subjective:  Patient reports pain as mild.  Patient in the chair and in no acute distress.  Patient denies any CP or SOB.  Objective:   VITALS:   Vitals:   10/21/16 0000 10/21/16 0425 10/21/16 0644 10/21/16 0800  BP:   128/77   Pulse:   (!) 101   Resp: (!) 22 12 16 15   Temp:   99.4 F (37.4 C)   TempSrc:   Oral   SpO2: 97% 98% 98% 97%  Weight:      Height:        ABD soft Sensation intact distally Dorsiflexion/Plantar flexion intact Incision: dressing C/D/I and no drainage Dressing changed and wounds look good  Lab Results  Component Value Date   WBC 6.6 10/21/2016   HGB 7.1 (L) 10/21/2016   HCT 20.8 (L) 10/21/2016   MCV 94.5 10/21/2016   PLT 157 10/21/2016   BMET    Component Value Date/Time   NA 134 (L) 10/21/2016 0617   K 3.5 10/21/2016 0617   CL 102 10/21/2016 0617   CO2 26 10/21/2016 0617   GLUCOSE 108 (H) 10/21/2016 0617   BUN 6 10/21/2016 0617   CREATININE 0.67 10/21/2016 0617   CALCIUM 8.0 (L) 10/21/2016 0617   GFRNONAA >60 10/21/2016 0617   GFRAA >60 10/21/2016 0617     Assessment/Plan: 2 Days Post-Op   Active Problems:   Femur fracture (HCC)   Advance diet Up with therapy NWB right lower ext Dry dressing PRN of hip and knee Splint at all times to right ankle Bledsoe brace at all times   DOUGLAS PARRY, BRANDON 10/21/2016, 11:08 AM  ABLA:  Significant, and may need blood, although no current symptoms or signs of active bleeding.  Will recheck hgb in am and defer to Dr. Carola FrostHandy if blood transfusion indicated.   Teryl LucyJoshua Reisha Wos, MD Cell [phone]
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
## 2016-10-22 ENCOUNTER — Encounter (HOSPITAL_COMMUNITY): Payer: Self-pay | Admitting: Orthopedic Surgery

## 2016-10-22 DIAGNOSIS — S82141A Displaced bicondylar fracture of right tibia, initial encounter for closed fracture: Secondary | ICD-10-CM

## 2016-10-22 DIAGNOSIS — S82401A Unspecified fracture of shaft of right fibula, initial encounter for closed fracture: Secondary | ICD-10-CM

## 2016-10-22 DIAGNOSIS — M25371 Other instability, right ankle: Secondary | ICD-10-CM

## 2016-10-22 DIAGNOSIS — F17200 Nicotine dependence, unspecified, uncomplicated: Secondary | ICD-10-CM

## 2016-10-22 HISTORY — DX: Unspecified fracture of shaft of right fibula, initial encounter for closed fracture: S82.401A

## 2016-10-22 HISTORY — DX: Displaced bicondylar fracture of right tibia, initial encounter for closed fracture: S82.141A

## 2016-10-22 HISTORY — DX: Nicotine dependence, unspecified, uncomplicated: F17.200

## 2016-10-22 HISTORY — DX: Other instability, right ankle: M25.371

## 2016-10-22 LAB — BASIC METABOLIC PANEL
Anion gap: 8 (ref 5–15)
BUN: <5 mg/dL — ABNORMAL LOW (ref 6–20)
CALCIUM: 8.2 mg/dL — AB (ref 8.9–10.3)
CO2: 28 mmol/L (ref 22–32)
CREATININE: 0.69 mg/dL (ref 0.61–1.24)
Chloride: 99 mmol/L — ABNORMAL LOW (ref 101–111)
GFR calc non Af Amer: >60 mL/min (ref >60)
GLUCOSE: 107 mg/dL — AB (ref 65–99)
Potassium: 3.5 mmol/L (ref 3.5–5.1)
SODIUM: 135 mmol/L (ref 135–145)

## 2016-10-22 LAB — CBC
HCT: 20.8 % — ABNORMAL LOW (ref 39.0–52.0)
Hemoglobin: 7.1 g/dL — ABNORMAL LOW (ref 13.0–17.0)
MCH: 32.4 pg (ref 26.0–34.0)
MCHC: 34.1 g/dL (ref 30.0–36.0)
MCV: 95 fL (ref 78.0–100.0)
PLATELETS: 200 10*3/uL (ref 150–400)
RBC: 2.19 MIL/uL — AB (ref 4.22–5.81)
RDW: 12.1 % (ref 11.5–15.5)
WBC: 6.5 10*3/uL (ref 4.0–10.5)

## 2016-10-22 LAB — PREPARE RBC (CROSSMATCH)

## 2016-10-22 LAB — ABO/RH: ABO/RH(D): O POS

## 2016-10-22 MED ORDER — OXYCODONE-ACETAMINOPHEN 5-325 MG PO TABS
1.0000 | ORAL_TABLET | Freq: Four times a day (QID) | ORAL | Status: DC | PRN
  Administered 2016-10-23 (×2): 2 via ORAL
  Filled 2016-10-22 (×2): qty 2

## 2016-10-22 MED ORDER — ACETAMINOPHEN 325 MG PO TABS
650.0000 mg | ORAL_TABLET | Freq: Once | ORAL | Status: AC
  Administered 2016-10-22: 650 mg via ORAL
  Filled 2016-10-22: qty 2

## 2016-10-22 MED ORDER — FUROSEMIDE 10 MG/ML IJ SOLN
20.0000 mg | Freq: Once | INTRAMUSCULAR | Status: AC
  Administered 2016-10-22: 20 mg via INTRAVENOUS
  Filled 2016-10-22: qty 2

## 2016-10-22 MED ORDER — DIPHENHYDRAMINE HCL 25 MG PO CAPS
25.0000 mg | ORAL_CAPSULE | Freq: Once | ORAL | Status: AC
  Administered 2016-10-22: 25 mg via ORAL
  Filled 2016-10-22: qty 1

## 2016-10-22 MED ORDER — SODIUM CHLORIDE 0.9 % IV SOLN
Freq: Once | INTRAVENOUS | Status: AC
  Administered 2016-10-22: 10:00:00 via INTRAVENOUS

## 2016-10-22 NOTE — Care Management Note (Signed)
Case Management Note  Patient Details  Name: Troy Rios MRN: [id number] Date of Birth: 07-05-1976  Subjective/Objective:          CM following for progression and d/c planning.           Action/Plan: 10/22/2016 Noted order for [facility] services and DME, also noted that CM met with pt and that pt states that he has a walker and declined a 3:1. HH to be provided by [facility]. Pt has assistance from his mother and neighbors per pt and CM notes . Will follow for any further needs.   Expected Discharge Date:                  Expected Discharge Plan:  [facility]  In-House Referral:     Discharge planning Services  CM Consult  Post Acute Care Choice:   NA Choice offered to:   NA  DME Arranged:   NA DME Agency:   NA  HH Arranged:  PT, RN [place] Agency:  [place]  Status of Service:  Completed, signed off  If discussed at [place] of Stay Meetings, dates discussed:    Additional Comments:  Adron Bene, RN 10/22/2016, 8:44 AM

## 2016-10-22 NOTE — Progress Notes (Signed)
Occupational Therapy Treatment Patient Details Name: Troy RuddyDavid Gerome Rios MRN: [id number] DOB: Jun 08, 1976 Today's Date: 10/22/2016    History of present illness [facility] femur fx, and Rt tibial plateau fx. Pt with positive tobacco use. Has had previous LE injuries per pt that he recovered from.   OT comments  Pt is adequate level for d/c home when ready with pending blood transfusion today. Pt pleasant and demonstrates ability to adjust and strap R LE hinge brace. Pt will have mother (A) PRN if needed at home level.   Follow Up Recommendations       Equipment Recommendations       Recommendations for Other Services      Precautions / Restrictions Precautions Precautions: Fall Required Braces or Orthoses: Other Brace/Splint Other Brace/Splint: Hinged brace  Restrictions Weight Bearing Restrictions: Yes RLE Weight Bearing: Non weight bearing LLE Weight Bearing: Weight bearing as tolerated       Mobility Bed Mobility Overal bed mobility: Independent             General bed mobility comments: using BIL UE to move R LE to EOB  Transfers Overall transfer level: Needs assistance Equipment used: Rolling walker (2 wheeled) Transfers: Sit to/from Stand Sit to Stand: Min guard              Balance Overall balance assessment: Needs assistance Sitting-balance support: No upper extremity supported;Feet supported Sitting balance-Leahy Scale: Normal     Standing balance support: Bilateral upper extremity supported;During functional activity Standing balance-Leahy Scale: Good                     ADL Overall ADL's : Needs assistance/impaired Eating/Feeding: Independent   Grooming: Wash/dry hands;Wash/dry face;Supervision/safety               Lower Body Dressing: Min guard;Sitting/lateral leans Lower Body Dressing Details (indicate cue type and reason): pt able to reach ankle to apply ice back and adjust brace.  Toilet  Transfer: Min guard           Functional mobility during ADLs: Min guard General ADL Comments: Pt able to adjust brace on R LE and educated on use of shorts for dressing. Pt has mother (A) to bring clothing from home. pt noted to have incr HR with movement 152 now with pending blood transfusion      Vision                     Perception     Praxis      Cognition   Behavior [facility] tasks assessed/performed Overall Cognitive Status: Within Functional Limits for tasks assessed                         Exercises     Shoulder Instructions       General Comments      Pertinent Vitals/ Pain       Pain Assessment: Faces Faces Pain Scale: Hurts a little bit Pain Location: RLE Pain Descriptors / Indicators: Throbbing Pain Intervention(s): Monitored during session;Premedicated before session;Repositioned  Home Living                                          Prior Functioning/Environment              Frequency  Progress Toward Goals  OT Goals(current goals can now be found in the care plan section)  Progress towards OT goals: Progressing toward goals  Acute Rehab OT Goals Patient Stated Goal: to go home OT Goal Formulation: With patient Time For Goal Achievement: 11/03/16 Potential to Achieve Goals: Good ADL Goals Pt Will Perform Lower Body Bathing: with supervision;with adaptive equipment;sitting/lateral leans Pt Will Perform Lower Body Dressing: with supervision;with adaptive equipment;sitting/lateral leans Pt Will Transfer to Toilet: with supervision;ambulating;regular height toilet Pt Will Perform Toileting - Clothing Manipulation and hygiene: with supervision;sitting/lateral leans Pt/caregiver will Perform Home Exercise Program: Left upper extremity;Increased ROM;Increased strength;With written HEP provided;With Supervision Additional ADL Goal #1: Pt will identify 3 fall prevention strategies to  incorporate into daily routine to improve safety with ADL.  Plan      Co-evaluation                 End of Session Equipment Utilized During Treatment: Gait belt;Rolling walker      Activity Tolerance Patient tolerated treatment well   Patient Left in chair;with call bell/phone within reach   Nurse Communication Mobility status;Precautions        Time: 1610-9604 OT Time Calculation (min): 58 min  Charges: OT General Charges $OT Visit: 1 Procedure OT Treatments $Self Care/Home Management : 23-37 mins $Therapeutic Activity: 23-37 mins   Mateo Flow   OTR/L Pager: [phone] Office: [phone] .    Boone Master B 10/22/2016, 10:11 AM

## 2016-10-22 NOTE — Progress Notes (Signed)
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  Respiratory: Negative for shortness of breath.   Cardiovascular: Negative for chest pain.  Gastrointestinal: Negative for nausea and vomiting.  Neurological: Negative for tingling and sensory change.      Objective:   VITALS:   Vitals:   10/21/16 2009 10/21/16 2255 10/22/16 0341 10/22/16 0625  BP:  (!) 148/86  116/71  Pulse:  (!) 103  94  Resp: 11 14 14 16   Temp:  99.2 F (37.3 C)  98.3 F (36.8 C)  TempSrc:  Oral  Oral  SpO2: 95% 100% 98% 98%  Weight:      Height:        Intake/Output      03/18 0701 - 03/19 0700 03/19 0701 - 03/20 0700   P.O. 1560 600   I.V. (mL/kg) 2400 (24)    Total Intake(mL/kg) 3960 (39.7) 600 (6)   Urine (mL/kg/hr) 800 (0.3) 850 (2.8)   Stool 0 (0)    Total Output 800 850   Net +3160 -250        Urine Occurrence 2 x    Stool Occurrence 3 x      LABS  Results for orders placed or performed during the hospital encounter of 10/18/16 (from the past 24 hour(s))  Basic metabolic panel     Status: Abnormal   Collection Time: 10/22/16  5:05 AM  Result Value Ref Range   Sodium 135 135 - 145 mmol/L   Potassium 3.5 3.5 - 5.1 mmol/L   Chloride 99 (L) 101 - 111 mmol/L   CO2 28 22 - 32 mmol/L   Glucose, Bld 107 (H) 65 - 99 mg/dL   BUN <5 (L) 6 - 20 mg/dL   Creatinine, Ser 4.09 0.61 - 1.24 mg/dL   Calcium 8.2 (L) 8.9 - 10.3 mg/dL   GFR calc non Af Amer >60 >60 mL/min   GFR calc Af Amer >60 >60 mL/min   Anion gap 8 5 - 15  CBC     Status: Abnormal   Collection Time: 10/22/16  5:05 AM  Result Value Ref Range    WBC 6.5 4.0 - 10.5 K/uL   RBC 2.19 (L) 4.22 - 5.81 MIL/uL   Hemoglobin 7.1 (L) 13.0 - 17.0 g/dL   HCT 81.1 (L) 91.4 - 78.2 %   MCV 95.0 78.0 - 100.0 fL   MCH 32.4 26.0 - 34.0 pg   MCHC 34.1 30.0 - 36.0 g/dL   RDW 95.6 21.3 - 08.6 %   Platelets 200 150 - 400 K/uL     PHYSICAL EXAM:   Gen: sitting on EOB, NAD, appears well Lungs: dec at bases but o/w clear Cardiac: s1 and s2, slightly tachy  Abd: + BS, NTND Ext:       Right Lower Extremity   Dressings c/d/I  SLS fitting well  Ext warm  Hinged brace fitting well and it is unlocked  Pt sitting with knee flexed to about 75 degrees  Swelling stable  DPN, SPN, TN sensation intact  EHL, FHL, lesser toe  motor functions intact  + quad set   Assessment/Plan: 3 Days Post-Op   Principal Problem:   Femur fracture (HCC) Active Problems:   Postoperative anemia due to acute blood loss   Nicotine dependence   Anti-infectives    Start     Dose/Rate Route Frequency Ordered Stop   10/19/16 2300  clindamycin (CLEOCIN) IVPB 600 mg     600 mg 100 mL/hr over 30 Minutes Intravenous Every 6 hours 10/19/16 2157 10/20/16 1117   10/19/16 1442  ceFAZolin (ANCEF) 2-4 GM/100ML-% IVPB    Comments:  Beckner, Alisha   : cabinet override      10/19/16 1442 10/20/16 0259    .  POD/HD#: 713  41 y/o s/p [facility]  - [facility]  - comminuted R femoral shaft fx s/p IMN  NWB R leg 6-8 weeks   R hip and knee ROM as tolerated  Ice and elevate for swelling and pain control  PT/OT  Ok to clean wounds with soap and water  - R tibial plateau fx  Appears to be an avulsion type fx involving PCL  Pt with some posterior laxity with exam in OR  No varus or valgus laxity noted  Have placed in hinged knee brace, brace set to 0 degrees of extension   NWB R leg  Hopeful to avoid subsequent reconstructive surgery   - R ankle instability with nondisplaced fibula fx  Will splint for 2 weeks then convert to CAM and begin ROM  NWB x 6-8 weeks   - Pain  management:  Percocet and Oxy IR  Dc pca  - ABL anemia/Hemodynamics  Will transfuse 2 units of PRBCs given symptoms with therapy and at rest   - Medical issues   Nicotine dependence   Discussed importance of smoking cessation   - DVT/PE prophylaxis:  lovenox x 4 weeks  - ID:   periop abx completed   - Activity:  NWB R leg o/w as tolerated  - FEN/GI prophylaxis/Foley/Lines:  Reg diet    - Impediments to fracture healing:  Nicotine dependence  - Dispo:  PRBCs today  Dc home tomorrow     Mearl LatinKeith W. Pratham Cassatt, PA-C [facility] [phone] (P) [phone] (O) 10/22/2016, 10:00 AM
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
## 2016-10-22 NOTE — Progress Notes (Signed)
D/C PCA

## 2016-10-22 NOTE — Progress Notes (Signed)
Physical Therapy Treatment Patient Details Name: Troy RuddyDavid Gerome Friedmann MRN: [id number] DOB: 1976/07/22 Today's Date: 10/22/2016    History of Present Illness [facility], and Rt tibial plateau fx. Pt with positive tobacco use. Has had previous LE injuries per pt that he recovered from.    PT Comments    Pt is making good progress toward his goals. Pt min guard with transfer to RW and minA for ambulation of 80 feet with RW, one minor LoB requiring minA to steady. Pt felt confident with getting into and around his house at d/c. Pt requires continued skilled PT for maintaining LE ROM, and improving LE strength and endurance in gait for safe mobility in his home environment.  Follow Up Recommendations  Home health PT;Supervision/Assistance - 24 hour     Equipment Recommendations  Rolling walker with 5" wheels    Recommendations for Other Services       Precautions / Restrictions Precautions Precautions: Fall Required Braces or Orthoses: Other Brace/Splint Other Brace/Splint: Hinged brace  Restrictions Weight Bearing Restrictions: Yes RLE Weight Bearing: Non weight bearing LLE Weight Bearing: Weight bearing as tolerated    Mobility  Bed Mobility               General bed mobility comments: seated in recliner at entry  Transfers Overall transfer level: Needs assistance Equipment used: Rolling walker (2 wheeled) Transfers: Sit to/from Stand Sit to Stand: Min guard         General transfer comment: vc to scoot further toward edge of recliner. Pt requires minor steadying upon coming to standing  Ambulation/Gait Ambulation/Gait assistance: Min assist Ambulation Distance (Feet): 80 Feet Assistive device: Rolling walker (2 wheeled) Gait Pattern/deviations: Step-to pattern;Decreased stride length;Antalgic Gait velocity: slow Gait velocity interpretation: Below normal speed for age/gender General Gait Details: Good adherence to  NWB, one minor LoB backward requiring minA to steady, pt required 1 seated rest break due to SoB      Balance Overall balance assessment: Needs assistance Sitting-balance support: No upper extremity supported;Feet supported Sitting balance-Leahy Scale: Normal     Standing balance support: Bilateral upper extremity supported;During functional activity Standing balance-Leahy Scale: Good                      Cognition Arousal/Alertness: Awake/alert Behavior During Therapy: WFL for tasks assessed/performed Overall Cognitive Status: Within Functional Limits for tasks assessed                      Exercises Total Joint Exercises Long Arc Quad: AROM;Right;10 reps;Seated Knee Flexion: AROM;10 reps;Seated;Right General Exercises - Lower Extremity Hip Flexion/Marching: AROM;Right;10 reps;Seated    General Comments General comments (skin integrity, edema, etc.): Pt receiving blood transfusion during session. Pt feeling very hot after gait replenished ice packs to aid in cooling.      Pertinent Vitals/Pain Pain Assessment: Faces Faces Pain Scale: Hurts a little bit Pain Location: RLE Pain Descriptors / Indicators: Throbbing Pain Intervention(s): Monitored during session  VSS    Home Living Family/patient expects to be discharged to:: Private residence Living Arrangements: Alone;Other (Comment)                      PT Goals (current goals can now be found in the care plan section) Acute Rehab PT Goals Patient Stated Goal: to go home Potential to Achieve Goals: Good Progress towards PT goals: Progressing toward goals    Frequency  Min 6X/week      PT Plan Current plan remains appropriate    Co-evaluation             End of Session Equipment Utilized During Treatment: Gait belt Activity Tolerance: Patient tolerated treatment well Patient left: in chair;with family/visitor present;with call bell/phone within reach Nurse Communication:  Mobility status PT Visit Diagnosis: Other abnormalities of gait and mobility (R26.89);Pain Pain - Right/Left: Right Pain - part of body: Knee     Time: 1610-9604 PT Time Calculation (min) (ACUTE ONLY): 28 min  Charges:  $Gait Training: 8-22 mins $Therapeutic Exercise: 8-22 mins                    G Codes:       Elon Alas Fleet 10/22/2016, 4:55 PM  Courtney Paris. Beverely Risen PT, DPT Acute Rehabilitation  [phone] Pager [phone]

## 2016-10-23 ENCOUNTER — Encounter (HOSPITAL_COMMUNITY): Payer: Self-pay | Admitting: Orthopedic Surgery

## 2016-10-23 LAB — TYPE AND SCREEN
ABO/RH(D): O POS
ANTIBODY SCREEN: NEGATIVE
UNIT DIVISION: 0
Unit division: 0

## 2016-10-23 LAB — BPAM RBC
BLOOD PRODUCT EXPIRATION DATE: 201804092359
Blood Product Expiration Date: 201803192359
ISSUE DATE / TIME: 201803191129
ISSUE DATE / TIME: 201803191440
UNIT TYPE AND RH: 9500
Unit Type and Rh: 5100

## 2016-10-23 LAB — CBC
HCT: 27 % — ABNORMAL LOW (ref 39.0–52.0)
Hemoglobin: 9.4 g/dL — ABNORMAL LOW (ref 13.0–17.0)
MCH: 31.2 pg (ref 26.0–34.0)
MCHC: 34.8 g/dL (ref 30.0–36.0)
MCV: 89.7 fL (ref 78.0–100.0)
PLATELETS: 293 10*3/uL (ref 150–400)
RBC: 3.01 MIL/uL — ABNORMAL LOW (ref 4.22–5.81)
RDW: 14.1 % (ref 11.5–15.5)
WBC: 8.6 10*3/uL (ref 4.0–10.5)

## 2016-10-23 MED ORDER — ENOXAPARIN (LOVENOX) PATIENT EDUCATION KIT
PACK | Freq: Once | Status: AC
  Administered 2016-10-23: 13:00:00
  Filled 2016-10-23: qty 1

## 2016-10-23 MED ORDER — POLYETHYLENE GLYCOL 3350 17 G PO PACK: 17.0000 g | PACK | Freq: Every day | ORAL | 0 refills | Status: AC

## 2016-10-23 MED ORDER — ENOXAPARIN SODIUM 40 MG/0.4ML ~~LOC~~ SOLN: 40.0000 mg | SUBCUTANEOUS | 0 refills | Status: AC

## 2016-10-23 MED ORDER — OXYCODONE HCL 5 MG PO TABS: 5.0000 mg | ORAL_TABLET | Freq: Four times a day (QID) | ORAL | 0 refills | Status: AC | PRN

## 2016-10-23 MED ORDER — OXYCODONE-ACETAMINOPHEN 5-325 MG PO TABS: 1.0000 | ORAL_TABLET | Freq: Four times a day (QID) | ORAL | 0 refills | Status: AC | PRN

## 2016-10-23 MED ORDER — MUPIROCIN 2 % EX OINT: 1.0000 "application " | TOPICAL_OINTMENT | Freq: Two times a day (BID) | CUTANEOUS | 0 refills | Status: AC

## 2016-10-23 MED ORDER — METHOCARBAMOL 500 MG PO TABS: 500.0000 mg | ORAL_TABLET | Freq: Four times a day (QID) | ORAL | 0 refills | Status: AC | PRN

## 2016-10-23 NOTE — Progress Notes (Signed)
Pt ready for d/c home today per MD. Pt met all PT/OT goals, has rolling walker at home and he declines 3N1. Discharge instructions and prescriptions reviewed with pt and his mother, all questions answered. Lovenox teaching was done and kit given.   Clyde, Jerry Caras

## 2016-10-23 NOTE — Progress Notes (Signed)
Orthopedic Tech Progress Note Patient Details:  Troy RuddyDavid Gerome Rios 1975-09-05 [id number]  Ortho Devices Type of Ortho Device: CAM walker Ortho Device/Splint Location: Level 2 Trauma   Saul FordyceJennifer C Sabena Winner 10/23/2016, 10:36 AM
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
## 2016-10-23 NOTE — Care Management (Signed)
Case manager received call from [facility] Liaison stating that they can not service patient due to Insurance. Patient has [employer]. CM called referral to Katharina Caperrew Wilke, [facility]. Waiting for response.
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
## 2016-10-23 NOTE — Progress Notes (Signed)
Pt called RN to enter room to look at his dressing. Observed pt had removed his brace in order to get 'cleaned up.' States will be using wipes to clean up (chg wipes on bedside table.) Pt had also removed part of the dressing on his right leg, superior to the splint, and had pulled back some of the soft casting dressing above the splint. Encouraged patient not to further remove dressing and not to remove other surgical dressings so MD can do so and evaluate sites. This RN placed vaseline gauze, non adherent pad, and rewrapped sites superior of splint with kerlex and ace bandage. Three sites observed to have superficial layer of skin off, wound base is pink & spotty red, not bleeding or draining fluid, no s/s infxn noted.

## 2016-10-23 NOTE — Discharge Instructions (Signed)
Orthopaedic Trauma Service Discharge Instructions   General Discharge Instructions  WEIGHT BEARING STATUS: Nonweightbearing Right Leg  RANGE OF MOTION/ACTIVITY: Unrestricted range of motion right hip and right knee. Do not remove splint on right ankle No pillows under bend the knee when at rest. If you need to elevate her leg place pillows under knee to ankle to keep knee as straight as possible  Wound Care: Daily wound care as needed to incisions on right thigh. Do not remove splint on right lower leg  Okay to remove TED hose (white stocking) on left leg at night. Continue to wear TED hose if you have persistent swelling  DVT/PE prophylaxis: you will be on Lovenox injections daily for the next 28 days to prevent blood clots. Please use all medication until completion. Failure to do so increases your risk of developing deep vein thrombosis and/or pulmonary embolism. It is also important to be as mobile as possible to prevent clot formation as well. However continue to be mindful of weightbearing restrictions on your right leg  PAIN MEDICATION USE AND EXPECTATIONS  You have likely been given narcotic medications to help control your pain.  After a traumatic event that results in an fracture (broken bone) with or without surgery, it is ok to use narcotic pain medications to help control one's pain.  We understand that everyone responds to pain differently and each individual patient will be evaluated on a regular basis for the continued need for narcotic medications. Ideally, narcotic medication use should last no more than 6-8 weeks (coinciding with fracture healing).   As a patient it is your responsibility as well to monitor narcotic medication use and report the amount and frequency you use these medications when you come to your office visit.   We would also advise that if you are using narcotic medications, you should take a dose prior to therapy to maximize you participation.  IF YOU ARE ON  NARCOTIC MEDICATIONS IT IS NOT PERMISSIBLE TO OPERATE A MOTOR VEHICLE (MOTORCYCLE/CAR/TRUCK/MOPED) OR HEAVY MACHINERY DO NOT MIX NARCOTICS WITH OTHER CNS (CENTRAL NERVOUS SYSTEM) DEPRESSANTS SUCH AS ALCOHOL  Diet: as you were eating previously.  Can use over the counter stool softeners and bowel preparations, such as Miralax, to help with bowel movements.  Narcotics can be constipating.  Be sure to drink plenty of fluids    STOP SMOKING OR USING NICOTINE PRODUCTS!!!!  As discussed nicotine severely impairs your body's ability to heal surgical and traumatic wounds but also impairs bone healing.  Wounds and bone heal by forming microscopic blood vessels (angiogenesis) and nicotine is a vasoconstrictor (essentially, shrinks blood vessels).  Therefore, if vasoconstriction occurs to these microscopic blood vessels they essentially disappear and are unable to deliver necessary nutrients to the healing tissue.  This is one modifiable factor that you can do to dramatically increase your chances of healing your injury.    (This means no smoking, no nicotine gum, patches, etc)  DO NOT USE NONSTEROIDAL ANTI-INFLAMMATORY DRUGS (NSAID'S)  Using products such as Advil (ibuprofen), Aleve (naproxen), Motrin (ibuprofen) for additional pain control during fracture healing can delay and/or prevent the healing response.  If you would like to take over the counter (OTC) medication, Tylenol (acetaminophen) is ok.  However, some narcotic medications that are given for pain control contain acetaminophen as well. Therefore, you should not exceed more than 4000 mg of tylenol in a day if you do not have liver disease.  Also note that there are may OTC medicines, such as  cold medicines and allergy medicines that my contain tylenol as well.  If you have any questions about medications and/or interactions please ask your doctor/PA or your pharmacist.      ICE AND ELEVATE INJURED/OPERATIVE EXTREMITY  Using ice and elevating the  injured extremity above your heart can help with swelling and pain control.  Icing in a pulsatile fashion, such as 20 minutes on and 20 minutes off, can be followed.    Do not place ice directly on skin. Make sure there is a barrier between to skin and the ice pack.    Using frozen items such as frozen peas works well as the conform nicely to the are that needs to be iced.  USE AN ACE WRAP OR TED HOSE FOR SWELLING CONTROL  In addition to icing and elevation, Ace wraps or TED hose are used to help limit and resolve swelling.  It is recommended to use Ace wraps or TED hose until you are informed to stop.    When using Ace Wraps start the wrapping distally (farthest away from the body) and wrap proximally (closer to the body)   Example: If you had surgery on your leg or thing and you do not have a splint on, start the ace wrap at the toes and work your way up to the thigh        If you had surgery on your upper extremity and do not have a splint on, start the ace wrap at your fingers and work your way up to the upper arm  IF YOU ARE IN A SPLINT OR CAST DO NOT REMOVE IT FOR ANY REASON   If your splint gets wet for any reason please contact the office immediately. You may shower in your splint or cast as long as you keep it dry.  This can be done by wrapping in a cast cover or garbage back (or similar)  Do Not stick any thing down your splint or cast such as pencils, money, or hangers to try and scratch yourself with.  If you feel itchy take benadryl as prescribed on the bottle for itching  IF YOU ARE IN A CAM BOOT (BLACK BOOT)  You may remove boot periodically. Perform daily dressing changes as noted below.  Wash the liner of the boot regularly and wear a sock when wearing the boot. It is recommended that you sleep in the boot until told otherwise  CALL THE OFFICE WITH ANY QUESTIONS OR CONCERNS: [phone]
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
## 2016-12-17 ENCOUNTER — Other Ambulatory Visit: Payer: Self-pay | Admitting: Orthopedic Surgery

## 2016-12-17 DIAGNOSIS — S83521A Sprain of posterior cruciate ligament of right knee, initial encounter: Secondary | ICD-10-CM

## 2016-12-17 DIAGNOSIS — Z77018 Contact with and (suspected) exposure to other hazardous metals: Secondary | ICD-10-CM

## 2016-12-25 ENCOUNTER — Ambulatory Visit
Admission: RE | Admit: 2016-12-25 | Discharge: 2016-12-25 | Disposition: A | Discharge status: Home / Self Care | Payer: 59 | Source: Ambulatory Visit | Attending: Orthopedic Surgery | Admitting: Orthopedic Surgery

## 2016-12-25 DIAGNOSIS — Z77018 Contact with and (suspected) exposure to other hazardous metals: Secondary | ICD-10-CM

## 2016-12-25 DIAGNOSIS — S83521A Sprain of posterior cruciate ligament of right knee, initial encounter: Secondary | ICD-10-CM
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
